# Patient Record
Sex: Female | Born: 1945 | Race: White | Hispanic: No | Smoking: Never smoker
Health system: Southern US, Community
[De-identification: ages and names within clinical notes are randomized; demographics above are authoritative.]

## PROBLEM LIST (undated history)

## (undated) DIAGNOSIS — G4730 Sleep apnea, unspecified: Secondary | ICD-10-CM

## (undated) DIAGNOSIS — K219 Gastro-esophageal reflux disease without esophagitis: Secondary | ICD-10-CM

## (undated) DIAGNOSIS — F988 Other specified behavioral and emotional disorders with onset usually occurring in childhood and adolescence: Secondary | ICD-10-CM

## (undated) DIAGNOSIS — F32A Depression, unspecified: Secondary | ICD-10-CM

## (undated) DIAGNOSIS — F329 Major depressive disorder, single episode, unspecified: Secondary | ICD-10-CM

## (undated) DIAGNOSIS — N809 Endometriosis, unspecified: Secondary | ICD-10-CM

---

## 1993-01-26 HISTORY — PX: ABDOMINAL HYSTERECTOMY: SHX81

## 1997-01-26 HISTORY — PX: BREAST CYST ASPIRATION: SHX578

## 2004-07-16 ENCOUNTER — Ambulatory Visit: Payer: Self-pay

## 2005-06-23 ENCOUNTER — Ambulatory Visit: Payer: Self-pay

## 2007-03-16 ENCOUNTER — Ambulatory Visit: Payer: Self-pay

## 2007-11-30 ENCOUNTER — Ambulatory Visit: Payer: Self-pay

## 2009-09-24 ENCOUNTER — Ambulatory Visit: Payer: Self-pay

## 2010-03-17 ENCOUNTER — Ambulatory Visit: Payer: Self-pay | Admitting: Unknown Physician Specialty

## 2010-11-18 ENCOUNTER — Ambulatory Visit: Payer: Self-pay

## 2012-03-23 ENCOUNTER — Emergency Department: Payer: Self-pay | Admitting: Unknown Physician Specialty

## 2012-03-23 LAB — BASIC METABOLIC PANEL
BUN: 12 mg/dL (ref 7–18)
Co2: 27 mmol/L (ref 21–32)
EGFR (Non-African Amer.): >60
Glucose: 89 mg/dL (ref 65–99)
Osmolality: 277 (ref 275–301)
Potassium: 4.2 mmol/L (ref 3.5–5.1)
Sodium: 139 mmol/L (ref 136–145)

## 2012-03-23 LAB — CBC
HCT: 40 % (ref 35.0–47.0)
HGB: 13.2 g/dL (ref 12.0–16.0)
MCH: 28.6 pg (ref 26.0–34.0)
MCHC: 33.1 g/dL (ref 32.0–36.0)
MCV: 86 fL (ref 80–100)
Platelet: 326 10*3/uL (ref 150–440)
RBC: 4.63 10*6/uL (ref 3.80–5.20)
RDW: 13.8 % (ref 11.5–14.5)

## 2012-03-23 LAB — CK TOTAL AND CKMB (NOT AT ARMC): CK, Total: 81 U/L (ref 21–215)

## 2012-12-26 ENCOUNTER — Emergency Department: Payer: Self-pay | Admitting: Emergency Medicine

## 2012-12-26 LAB — COMPREHENSIVE METABOLIC PANEL
Albumin: 4 g/dL (ref 3.4–5.0)
Alkaline Phosphatase: 91 U/L
Anion Gap: 3 — ABNORMAL LOW (ref 7–16)
BUN: 15 mg/dL (ref 7–18)
Calcium, Total: 9.1 mg/dL (ref 8.5–10.1)
Chloride: 107 mmol/L (ref 98–107)
Co2: 28 mmol/L (ref 21–32)
EGFR (Non-African Amer.): >60
Osmolality: 276 (ref 275–301)
Potassium: 3.8 mmol/L (ref 3.5–5.1)
SGOT(AST): 41 U/L — ABNORMAL HIGH (ref 15–37)
Sodium: 138 mmol/L (ref 136–145)

## 2012-12-26 LAB — CBC WITH DIFFERENTIAL/PLATELET
Basophil #: 0 10*3/uL (ref 0.0–0.1)
Basophil %: 0.8 %
Eosinophil %: 1.5 %
HCT: 40.5 % (ref 35.0–47.0)
HGB: 13.5 g/dL (ref 12.0–16.0)
Lymphocyte %: 37.2 %
MCH: 28.9 pg (ref 26.0–34.0)
MCV: 87 fL (ref 80–100)
Monocyte %: 6.3 %
RBC: 4.68 10*6/uL (ref 3.80–5.20)
RDW: 14.2 % (ref 11.5–14.5)
WBC: 6.5 10*3/uL (ref 3.6–11.0)

## 2012-12-26 LAB — CK TOTAL AND CKMB (NOT AT ARMC): CK, Total: 102 U/L (ref 21–215)

## 2013-11-09 ENCOUNTER — Ambulatory Visit: Payer: Self-pay | Admitting: Family Medicine

## 2014-03-05 ENCOUNTER — Ambulatory Visit: Payer: Self-pay | Admitting: Unknown Physician Specialty

## 2014-05-21 LAB — SURGICAL PATHOLOGY

## 2015-04-10 DIAGNOSIS — M5431 Sciatica, right side: Secondary | ICD-10-CM | POA: Diagnosis not present

## 2015-04-10 DIAGNOSIS — F331 Major depressive disorder, recurrent, moderate: Secondary | ICD-10-CM | POA: Diagnosis not present

## 2015-05-17 DIAGNOSIS — N390 Urinary tract infection, site not specified: Secondary | ICD-10-CM | POA: Diagnosis not present

## 2015-05-17 DIAGNOSIS — F909 Attention-deficit hyperactivity disorder, unspecified type: Secondary | ICD-10-CM | POA: Diagnosis not present

## 2015-05-17 DIAGNOSIS — M545 Low back pain: Secondary | ICD-10-CM | POA: Diagnosis not present

## 2015-05-17 DIAGNOSIS — Z1389 Encounter for screening for other disorder: Secondary | ICD-10-CM | POA: Diagnosis not present

## 2015-05-17 DIAGNOSIS — F331 Major depressive disorder, recurrent, moderate: Secondary | ICD-10-CM | POA: Diagnosis not present

## 2015-06-07 DIAGNOSIS — M545 Low back pain: Secondary | ICD-10-CM | POA: Diagnosis not present

## 2015-07-18 DIAGNOSIS — G4730 Sleep apnea, unspecified: Secondary | ICD-10-CM | POA: Diagnosis not present

## 2015-07-18 DIAGNOSIS — F909 Attention-deficit hyperactivity disorder, unspecified type: Secondary | ICD-10-CM | POA: Diagnosis not present

## 2015-07-18 DIAGNOSIS — M5431 Sciatica, right side: Secondary | ICD-10-CM | POA: Diagnosis not present

## 2015-09-03 ENCOUNTER — Ambulatory Visit: Payer: PPO | Attending: Neurology

## 2015-09-03 DIAGNOSIS — R0683 Snoring: Secondary | ICD-10-CM | POA: Diagnosis not present

## 2015-09-03 DIAGNOSIS — G4730 Sleep apnea, unspecified: Secondary | ICD-10-CM | POA: Diagnosis not present

## 2015-09-03 DIAGNOSIS — G4733 Obstructive sleep apnea (adult) (pediatric): Secondary | ICD-10-CM | POA: Diagnosis not present

## 2015-09-12 DIAGNOSIS — G4733 Obstructive sleep apnea (adult) (pediatric): Secondary | ICD-10-CM | POA: Diagnosis not present

## 2015-11-08 ENCOUNTER — Ambulatory Visit: Payer: PPO | Attending: Neurology

## 2015-11-08 DIAGNOSIS — Z7982 Long term (current) use of aspirin: Secondary | ICD-10-CM | POA: Insufficient documentation

## 2015-11-08 DIAGNOSIS — G4733 Obstructive sleep apnea (adult) (pediatric): Secondary | ICD-10-CM | POA: Insufficient documentation

## 2015-11-08 DIAGNOSIS — Z79899 Other long term (current) drug therapy: Secondary | ICD-10-CM | POA: Diagnosis not present

## 2015-11-13 DIAGNOSIS — G4733 Obstructive sleep apnea (adult) (pediatric): Secondary | ICD-10-CM | POA: Diagnosis not present

## 2015-12-11 DIAGNOSIS — G4733 Obstructive sleep apnea (adult) (pediatric): Secondary | ICD-10-CM | POA: Diagnosis not present

## 2016-01-10 DIAGNOSIS — G4733 Obstructive sleep apnea (adult) (pediatric): Secondary | ICD-10-CM | POA: Diagnosis not present

## 2016-02-10 DIAGNOSIS — G4733 Obstructive sleep apnea (adult) (pediatric): Secondary | ICD-10-CM | POA: Diagnosis not present

## 2016-02-20 DIAGNOSIS — E039 Hypothyroidism, unspecified: Secondary | ICD-10-CM | POA: Diagnosis not present

## 2016-02-20 DIAGNOSIS — Z23 Encounter for immunization: Secondary | ICD-10-CM | POA: Diagnosis not present

## 2016-02-20 DIAGNOSIS — Z0000 Encounter for general adult medical examination without abnormal findings: Secondary | ICD-10-CM | POA: Diagnosis not present

## 2016-02-20 DIAGNOSIS — M545 Low back pain: Secondary | ICD-10-CM | POA: Diagnosis not present

## 2016-02-20 DIAGNOSIS — F909 Attention-deficit hyperactivity disorder, unspecified type: Secondary | ICD-10-CM | POA: Diagnosis not present

## 2016-02-20 DIAGNOSIS — Z1389 Encounter for screening for other disorder: Secondary | ICD-10-CM | POA: Diagnosis not present

## 2016-02-20 DIAGNOSIS — Z1159 Encounter for screening for other viral diseases: Secondary | ICD-10-CM | POA: Diagnosis not present

## 2016-02-20 DIAGNOSIS — L989 Disorder of the skin and subcutaneous tissue, unspecified: Secondary | ICD-10-CM | POA: Diagnosis not present

## 2016-02-24 ENCOUNTER — Other Ambulatory Visit: Payer: Self-pay | Admitting: Family Medicine

## 2016-02-24 DIAGNOSIS — Z0000 Encounter for general adult medical examination without abnormal findings: Secondary | ICD-10-CM

## 2016-02-24 DIAGNOSIS — M8580 Other specified disorders of bone density and structure, unspecified site: Secondary | ICD-10-CM

## 2016-02-24 DIAGNOSIS — Z1231 Encounter for screening mammogram for malignant neoplasm of breast: Secondary | ICD-10-CM

## 2016-04-09 ENCOUNTER — Ambulatory Visit: Payer: PPO | Attending: Family Medicine

## 2016-04-22 DIAGNOSIS — H1713 Central corneal opacity, bilateral: Secondary | ICD-10-CM | POA: Diagnosis not present

## 2016-05-21 ENCOUNTER — Ambulatory Visit
Admission: RE | Admit: 2016-05-21 | Discharge: 2016-05-21 | Disposition: A | Discharge status: Home / Self Care | Payer: PPO | Source: Ambulatory Visit | Attending: Family Medicine | Admitting: Family Medicine

## 2016-05-21 DIAGNOSIS — M85852 Other specified disorders of bone density and structure, left thigh: Secondary | ICD-10-CM | POA: Insufficient documentation

## 2016-05-21 DIAGNOSIS — M8580 Other specified disorders of bone density and structure, unspecified site: Secondary | ICD-10-CM

## 2016-05-21 DIAGNOSIS — Z1231 Encounter for screening mammogram for malignant neoplasm of breast: Secondary | ICD-10-CM | POA: Diagnosis not present

## 2016-05-21 DIAGNOSIS — Z0000 Encounter for general adult medical examination without abnormal findings: Secondary | ICD-10-CM

## 2016-05-28 ENCOUNTER — Other Ambulatory Visit: Payer: Self-pay | Admitting: Family Medicine

## 2016-05-28 DIAGNOSIS — N6489 Other specified disorders of breast: Secondary | ICD-10-CM

## 2016-05-28 DIAGNOSIS — R928 Other abnormal and inconclusive findings on diagnostic imaging of breast: Secondary | ICD-10-CM

## 2016-06-01 DIAGNOSIS — R21 Rash and other nonspecific skin eruption: Secondary | ICD-10-CM | POA: Diagnosis not present

## 2016-06-01 DIAGNOSIS — Z1283 Encounter for screening for malignant neoplasm of skin: Secondary | ICD-10-CM | POA: Diagnosis not present

## 2016-06-01 DIAGNOSIS — S70362A Insect bite (nonvenomous), left thigh, initial encounter: Secondary | ICD-10-CM | POA: Diagnosis not present

## 2016-06-01 DIAGNOSIS — L578 Other skin changes due to chronic exposure to nonionizing radiation: Secondary | ICD-10-CM | POA: Diagnosis not present

## 2016-06-01 DIAGNOSIS — L508 Other urticaria: Secondary | ICD-10-CM | POA: Diagnosis not present

## 2016-06-01 DIAGNOSIS — L820 Inflamed seborrheic keratosis: Secondary | ICD-10-CM | POA: Diagnosis not present

## 2016-06-01 DIAGNOSIS — L812 Freckles: Secondary | ICD-10-CM | POA: Diagnosis not present

## 2016-06-01 DIAGNOSIS — L719 Rosacea, unspecified: Secondary | ICD-10-CM | POA: Diagnosis not present

## 2016-06-01 DIAGNOSIS — D1800 Hemangioma unspecified site: Secondary | ICD-10-CM | POA: Diagnosis not present

## 2016-06-03 DIAGNOSIS — H1713 Central corneal opacity, bilateral: Secondary | ICD-10-CM | POA: Diagnosis not present

## 2016-06-05 ENCOUNTER — Ambulatory Visit
Admission: RE | Admit: 2016-06-05 | Discharge: 2016-06-05 | Disposition: A | Discharge status: Home / Self Care | Payer: PPO | Source: Ambulatory Visit | Attending: Family Medicine | Admitting: Family Medicine

## 2016-06-05 ENCOUNTER — Encounter: Payer: Self-pay | Admitting: Radiology

## 2016-06-05 DIAGNOSIS — N6489 Other specified disorders of breast: Secondary | ICD-10-CM

## 2016-06-05 DIAGNOSIS — N6002 Solitary cyst of left breast: Secondary | ICD-10-CM | POA: Diagnosis not present

## 2016-06-05 DIAGNOSIS — R928 Other abnormal and inconclusive findings on diagnostic imaging of breast: Secondary | ICD-10-CM | POA: Diagnosis not present

## 2016-06-08 DIAGNOSIS — H1713 Central corneal opacity, bilateral: Secondary | ICD-10-CM | POA: Diagnosis not present

## 2016-09-02 DIAGNOSIS — L820 Inflamed seborrheic keratosis: Secondary | ICD-10-CM | POA: Diagnosis not present

## 2016-09-02 DIAGNOSIS — L578 Other skin changes due to chronic exposure to nonionizing radiation: Secondary | ICD-10-CM | POA: Diagnosis not present

## 2016-09-02 DIAGNOSIS — L821 Other seborrheic keratosis: Secondary | ICD-10-CM | POA: Diagnosis not present

## 2016-09-02 DIAGNOSIS — L719 Rosacea, unspecified: Secondary | ICD-10-CM | POA: Diagnosis not present

## 2016-09-02 DIAGNOSIS — D229 Melanocytic nevi, unspecified: Secondary | ICD-10-CM | POA: Diagnosis not present

## 2016-09-02 DIAGNOSIS — L812 Freckles: Secondary | ICD-10-CM | POA: Diagnosis not present

## 2016-09-02 DIAGNOSIS — S00461A Insect bite (nonvenomous) of right ear, initial encounter: Secondary | ICD-10-CM | POA: Diagnosis not present

## 2017-01-11 DIAGNOSIS — E039 Hypothyroidism, unspecified: Secondary | ICD-10-CM | POA: Diagnosis not present

## 2017-01-11 DIAGNOSIS — E785 Hyperlipidemia, unspecified: Secondary | ICD-10-CM | POA: Diagnosis not present

## 2017-06-02 ENCOUNTER — Other Ambulatory Visit: Payer: Self-pay | Admitting: Family Medicine

## 2017-06-02 DIAGNOSIS — Z1231 Encounter for screening mammogram for malignant neoplasm of breast: Secondary | ICD-10-CM

## 2017-06-10 DIAGNOSIS — K219 Gastro-esophageal reflux disease without esophagitis: Secondary | ICD-10-CM | POA: Diagnosis not present

## 2017-06-10 DIAGNOSIS — Z86010 Personal history of colonic polyps: Secondary | ICD-10-CM | POA: Diagnosis not present

## 2017-06-10 DIAGNOSIS — R1310 Dysphagia, unspecified: Secondary | ICD-10-CM | POA: Diagnosis not present

## 2017-06-18 ENCOUNTER — Ambulatory Visit
Admission: RE | Admit: 2017-06-18 | Discharge: 2017-06-18 | Disposition: A | Discharge status: Home / Self Care | Payer: PPO | Source: Ambulatory Visit | Attending: Family Medicine | Admitting: Family Medicine

## 2017-06-18 DIAGNOSIS — Z1231 Encounter for screening mammogram for malignant neoplasm of breast: Secondary | ICD-10-CM | POA: Diagnosis not present

## 2017-06-25 ENCOUNTER — Other Ambulatory Visit: Payer: Self-pay | Admitting: Family Medicine

## 2017-06-25 DIAGNOSIS — N6489 Other specified disorders of breast: Secondary | ICD-10-CM

## 2017-06-25 DIAGNOSIS — R928 Other abnormal and inconclusive findings on diagnostic imaging of breast: Secondary | ICD-10-CM

## 2017-06-30 ENCOUNTER — Ambulatory Visit
Admission: RE | Admit: 2017-06-30 | Discharge: 2017-06-30 | Disposition: A | Discharge status: Home / Self Care | Payer: PPO | Source: Ambulatory Visit | Attending: Family Medicine | Admitting: Family Medicine

## 2017-06-30 DIAGNOSIS — R928 Other abnormal and inconclusive findings on diagnostic imaging of breast: Secondary | ICD-10-CM

## 2017-06-30 DIAGNOSIS — N6489 Other specified disorders of breast: Secondary | ICD-10-CM | POA: Insufficient documentation

## 2017-06-30 DIAGNOSIS — R922 Inconclusive mammogram: Secondary | ICD-10-CM | POA: Diagnosis not present

## 2017-07-06 DIAGNOSIS — Z86010 Personal history of colonic polyps: Secondary | ICD-10-CM | POA: Diagnosis not present

## 2017-07-22 DIAGNOSIS — H43399 Other vitreous opacities, unspecified eye: Secondary | ICD-10-CM | POA: Diagnosis not present

## 2017-08-18 DIAGNOSIS — M25562 Pain in left knee: Secondary | ICD-10-CM | POA: Diagnosis not present

## 2017-08-20 ENCOUNTER — Encounter: Payer: Self-pay | Admitting: *Deleted

## 2017-08-23 ENCOUNTER — Encounter: Payer: Self-pay | Admitting: Anesthesiology

## 2017-08-23 ENCOUNTER — Ambulatory Visit: Payer: PPO | Admitting: Anesthesiology

## 2017-08-23 ENCOUNTER — Ambulatory Visit
Admission: RE | Admit: 2017-08-23 | Discharge: 2017-08-23 | Disposition: A | Discharge status: Home / Self Care | Payer: PPO | Source: Ambulatory Visit | Attending: Unknown Physician Specialty | Admitting: Unknown Physician Specialty

## 2017-08-23 ENCOUNTER — Other Ambulatory Visit: Payer: Self-pay

## 2017-08-23 ENCOUNTER — Encounter
Admission: RE | Disposition: A | Discharge status: Home / Self Care | Payer: Self-pay | Source: Ambulatory Visit | Attending: Unknown Physician Specialty

## 2017-08-23 DIAGNOSIS — F329 Major depressive disorder, single episode, unspecified: Secondary | ICD-10-CM | POA: Diagnosis not present

## 2017-08-23 DIAGNOSIS — Z79899 Other long term (current) drug therapy: Secondary | ICD-10-CM | POA: Insufficient documentation

## 2017-08-23 DIAGNOSIS — G4730 Sleep apnea, unspecified: Secondary | ICD-10-CM | POA: Insufficient documentation

## 2017-08-23 DIAGNOSIS — Z86010 Personal history of colonic polyps: Secondary | ICD-10-CM | POA: Diagnosis not present

## 2017-08-23 DIAGNOSIS — Q439 Congenital malformation of intestine, unspecified: Secondary | ICD-10-CM | POA: Insufficient documentation

## 2017-08-23 DIAGNOSIS — K640 First degree hemorrhoids: Secondary | ICD-10-CM | POA: Diagnosis not present

## 2017-08-23 DIAGNOSIS — K219 Gastro-esophageal reflux disease without esophagitis: Secondary | ICD-10-CM | POA: Diagnosis not present

## 2017-08-23 DIAGNOSIS — K6389 Other specified diseases of intestine: Secondary | ICD-10-CM | POA: Diagnosis not present

## 2017-08-23 DIAGNOSIS — Z09 Encounter for follow-up examination after completed treatment for conditions other than malignant neoplasm: Secondary | ICD-10-CM | POA: Insufficient documentation

## 2017-08-23 DIAGNOSIS — K222 Esophageal obstruction: Secondary | ICD-10-CM | POA: Diagnosis not present

## 2017-08-23 DIAGNOSIS — K635 Polyp of colon: Secondary | ICD-10-CM | POA: Diagnosis not present

## 2017-08-23 DIAGNOSIS — D125 Benign neoplasm of sigmoid colon: Secondary | ICD-10-CM | POA: Diagnosis not present

## 2017-08-23 DIAGNOSIS — F988 Other specified behavioral and emotional disorders with onset usually occurring in childhood and adolescence: Secondary | ICD-10-CM | POA: Insufficient documentation

## 2017-08-23 DIAGNOSIS — R1310 Dysphagia, unspecified: Secondary | ICD-10-CM | POA: Diagnosis not present

## 2017-08-23 HISTORY — DX: Endometriosis, unspecified: N80.9

## 2017-08-23 HISTORY — DX: Depression, unspecified: F32.A

## 2017-08-23 HISTORY — DX: Other specified behavioral and emotional disorders with onset usually occurring in childhood and adolescence: F98.8

## 2017-08-23 HISTORY — DX: Major depressive disorder, single episode, unspecified: F32.9

## 2017-08-23 HISTORY — PX: COLONOSCOPY WITH PROPOFOL: SHX5780

## 2017-08-23 HISTORY — DX: Gastro-esophageal reflux disease without esophagitis: K21.9

## 2017-08-23 HISTORY — DX: Sleep apnea, unspecified: G47.30

## 2017-08-23 HISTORY — PX: ESOPHAGOGASTRODUODENOSCOPY (EGD) WITH PROPOFOL: SHX5813

## 2017-08-23 SURGERY — COLONOSCOPY WITH PROPOFOL
Anesthesia: General

## 2017-08-23 MED ORDER — LIDOCAINE HCL (PF) 2 % IJ SOLN
INTRAMUSCULAR | Status: DC | PRN
  Administered 2017-08-23: 80 mg

## 2017-08-23 MED ORDER — MIDAZOLAM HCL 5 MG/5ML IJ SOLN
INTRAMUSCULAR | Status: DC | PRN
  Administered 2017-08-23: 2 mg via INTRAVENOUS

## 2017-08-23 MED ORDER — SODIUM CHLORIDE 0.9 % IV SOLN
INTRAVENOUS | Status: DC
  Administered 2017-08-23: 14:00:00 via INTRAVENOUS

## 2017-08-23 MED ORDER — LIDOCAINE HCL (PF) 2 % IJ SOLN
INTRAMUSCULAR | Status: AC
  Filled 2017-08-23: qty 10

## 2017-08-23 MED ORDER — PROPOFOL 500 MG/50ML IV EMUL
INTRAVENOUS | Status: AC
  Filled 2017-08-23: qty 50

## 2017-08-23 MED ORDER — FENTANYL CITRATE (PF) 100 MCG/2ML IJ SOLN
INTRAMUSCULAR | Status: DC | PRN
  Administered 2017-08-23: 25 ug via INTRAVENOUS
  Administered 2017-08-23: 50 ug via INTRAVENOUS
  Administered 2017-08-23: 25 ug via INTRAVENOUS

## 2017-08-23 MED ORDER — GLYCOPYRROLATE 0.2 MG/ML IJ SOLN
INTRAMUSCULAR | Status: AC
  Filled 2017-08-23: qty 1

## 2017-08-23 MED ORDER — EPHEDRINE SULFATE 50 MG/ML IJ SOLN
INTRAMUSCULAR | Status: DC | PRN
  Administered 2017-08-23: 10 mg via INTRAVENOUS
  Administered 2017-08-23: 5 mg via INTRAVENOUS
  Administered 2017-08-23: 10 mg via INTRAVENOUS

## 2017-08-23 MED ORDER — PROPOFOL 10 MG/ML IV BOLUS
INTRAVENOUS | Status: DC | PRN
  Administered 2017-08-23: 30 mg via INTRAVENOUS
  Administered 2017-08-23 (×3): 20 mg via INTRAVENOUS

## 2017-08-23 MED ORDER — PROPOFOL 500 MG/50ML IV EMUL
INTRAVENOUS | Status: DC | PRN
  Administered 2017-08-23: 50 ug/kg/min via INTRAVENOUS

## 2017-08-23 MED ORDER — GLYCOPYRROLATE 0.2 MG/ML IJ SOLN
INTRAMUSCULAR | Status: DC | PRN
  Administered 2017-08-23: 0.2 mg via INTRAVENOUS

## 2017-08-23 MED ORDER — MIDAZOLAM HCL 2 MG/2ML IJ SOLN
INTRAMUSCULAR | Status: AC
  Filled 2017-08-23: qty 2

## 2017-08-23 MED ORDER — FENTANYL CITRATE (PF) 100 MCG/2ML IJ SOLN
INTRAMUSCULAR | Status: AC
  Filled 2017-08-23: qty 2

## 2017-08-23 NOTE — Anesthesia Postprocedure Evaluation (Signed)
Anesthesia Post Note  Patient: Sharon Palmer  Procedure(s) Performed: COLONOSCOPY WITH PROPOFOL (N/A ) ESOPHAGOGASTRODUODENOSCOPY (EGD) WITH PROPOFOL (N/A )  Patient location during evaluation: Endoscopy Anesthesia Type: General Level of consciousness: awake and alert Pain management: pain level controlled Vital Signs Assessment: post-procedure vital signs reviewed and stable Respiratory status: spontaneous breathing, nonlabored ventilation, respiratory function stable and patient connected to nasal cannula oxygen Cardiovascular status: blood pressure returned to baseline and stable Postop Assessment: no apparent nausea or vomiting Anesthetic complications: no     Last Vitals:  Vitals:   08/23/17 1454 08/23/17 1501  BP: 115/62 121/66  Pulse: 84   Resp: (!) 23   Temp:    SpO2: 99%     Last Pain:  Vitals:   08/23/17 1454  TempSrc:   PainSc: 0-No pain                 [name] S

## 2017-08-23 NOTE — H&P (Signed)
Primary Care Physician:  Denton Lank, MD Primary Gastroenterologist:  Dr. Vira Agar  Pre-Procedure History & Physical: HPI:  Sharon Palmer is a 72 y.o. female is here for an endoscopy and colonoscopy.  Done for [facility] colon polyps and dysphagia.   Past Medical History:  Diagnosis Date  . Attention deficit disorder   . Depression   . Endometriosis   . GERD (gastroesophageal reflux disease)   . Sleep apnea     Past Surgical History:  Procedure Laterality Date  . ABDOMINAL HYSTERECTOMY  1995  . BREAST CYST ASPIRATION Left 1999    Prior to Admission medications   Medication Sig Start Date End Date Taking? Authorizing Provider  Amphetamine-Dextroamphetamine (ADDERALL PO) Take by mouth 2 (two) times daily.   Yes [provider]  FLUOXETINE HCL PO Take by mouth daily.   Yes [provider]  pantoprazole (PROTONIX) 40 MG tablet Take 40 mg by mouth daily. Take 15-20 mins before meal   Yes [provider]  ROSUVASTATIN CALCIUM PO Take by mouth daily.   Yes [provider]    Allergies as of 06/23/2017  . (Not on File)    Family History  Problem Relation Age of Onset  . Breast cancer Neg Hx     Social History   Socioeconomic History  . Marital status: Widowed    Spouse name: Not on file  . Number of children: Not on file  . Years of education: Not on file  . Highest education level: Not on file  Occupational History  . Not on file  Social Needs  . Financial resource strain: Not on file  . Food insecurity:    Worry: Not on file    Inability: Not on file  . Transportation needs:    Medical: Not on file    Non-medical: Not on file  Tobacco Use  . Smoking status: Never Smoker  . Smokeless tobacco: Never Used  Substance and Sexual Activity  . Alcohol use: Yes    Alcohol/week: 1.8 oz    Types: 3 Shots of liquor per week    Comment: 3 drinks 3x a week  . Drug use: Not Currently  . Sexual activity: Not on file  Lifestyle  . Physical  activity:    Days per week: Not on file    Minutes per session: Not on file  . Stress: Not on file  Relationships  . Social connections:    Talks on phone: Not on file    Gets together: Not on file    Attends religious service: Not on file    Active member of club or organization: Not on file    Attends meetings of clubs or organizations: Not on file    Relationship status: Not on file  . Intimate partner violence:    Fear of current or ex partner: Not on file    Emotionally abused: Not on file    Physically abused: Not on file    Forced sexual activity: Not on file  Other Topics Concern  . Not on file  Social History Narrative  . Not on file    Review of Systems: See HPI, otherwise negative ROS  Physical Exam: BP (!) 149/96   Pulse 74   Temp (!) 97.4 F (36.3 C) (Tympanic)   Resp 18   Ht 5\' 5"  (1.651 m)   Wt 86.2 kg (190 lb)   SpO2 95%   BMI 31.62 kg/m  General:   Alert,  pleasant and  cooperative in NAD Head:  Normocephalic and atraumatic. Neck:  Supple; no masses or thyromegaly. Lungs:  Clear throughout to auscultation.    Heart:  Regular rate and rhythm. Abdomen:  Soft, nontender and nondistended. Normal bowel sounds, without guarding, and without rebound.   Neurologic:  Alert and  oriented x4;  grossly normal neurologically.  Impression/Plan: [name] is here for an endoscopy and colonoscopy to be performed for [facility] colon polyps and dysphagia.  Risks, benefits, limitations, and alternatives regarding  endoscopy and colonoscopy have been reviewed with the patient.  Questions have been answered.  All parties agreeable.   Gaylyn Cheers, MD  08/23/2017, 1:39 PM

## 2017-08-23 NOTE — Op Note (Signed)
[facility]astroenterology Patient Name: Sharon Palmer Procedure Date: 08/23/2017 1:39 PM MRN: [id number] Account #: [id number] Date of Birth: 05/16/1945 Admit Type: Outpatient Age: 72 Room: [facility] ENDO ROOM 1 Gender: Female Note Status: Finalized Procedure:            Colonoscopy Indications:          High risk colon cancer surveillance: Personal history                        of colonic polyps Providers:            Manya Silvas, MD Referring MD:         Denton Lank MD, MD (Referring MD) Medicines:            Propofol per Anesthesia Complications:        No immediate complications. Procedure:            Pre-Anesthesia Assessment:                       - After reviewing the risks and benefits, the patient                        was deemed in satisfactory condition to undergo the                        procedure.                       After obtaining informed consent, the colonoscope was                        passed under direct vision. Throughout the procedure,                        the patient's blood pressure, pulse, and oxygen                        saturations were monitored continuously. The                        Colonoscope was introduced through the anus and                        advanced to the the cecum, identified by appendiceal                        orifice and ileocecal valve. The colonoscopy was                        somewhat difficult due to a tortuous colon. Successful                        completion of the procedure was aided by applying                        abdominal pressure. The patient tolerated the procedure                        well. The quality of the bowel preparation was  excellent. Findings:      A diminutive polyp was found in the sigmoid colon. The polyp was       sessile. The polyp was removed with a jumbo cold forceps. Resection and       retrieval were complete.      Internal hemorrhoids were  found during endoscopy. The hemorrhoids were       small and Grade I (internal hemorrhoids that do not prolapse).      The exam was otherwise without abnormality. Impression:           - One diminutive polyp in the sigmoid colon, removed                        with a jumbo cold forceps. Resected and retrieved.                       - Internal hemorrhoids.                       - The examination was otherwise normal. Recommendation:       - Await pathology results. Manya Silvas, MD 08/23/2017 2:29:42 PM This report has been signed electronically. Number of Addenda: 0 Note Initiated On: 08/23/2017 1:39 PM Scope Withdrawal Time: 0 hours 6 minutes 55 seconds  Total Procedure Duration: 0 hours 21 minutes 8 seconds       [facility]

## 2017-08-23 NOTE — Anesthesia Preprocedure Evaluation (Signed)
Anesthesia Evaluation  Patient identified by MRN, date of birth, ID band Patient awake    Reviewed: Allergy & Precautions, NPO status , Patient's Chart, lab work & pertinent test results, reviewed documented beta blocker date and time   Airway Mallampati: III  TM Distance: >3 FB     Dental  (+) Chipped   Pulmonary           Cardiovascular      Neuro/Psych    GI/Hepatic   Endo/Other    Renal/GU      Musculoskeletal   Abdominal   Peds  Hematology   Anesthesia Other Findings   Reproductive/Obstetrics                             Anesthesia Physical Anesthesia Plan  ASA: II  Anesthesia Plan: General   Post-op Pain Management:    Induction: Intravenous  PONV Risk Score and Plan:   Airway Management Planned: Oral ETT  Additional Equipment:   Intra-op Plan:   Post-operative Plan:   Informed Consent: I have reviewed the patients History and Physical, chart, labs and discussed the procedure including the risks, benefits and alternatives for the proposed anesthesia with the patient or authorized representative who has indicated his/her understanding and acceptance.       Plan Discussed with: CRNA  Anesthesia Plan Comments:         Anesthesia Quick Evaluation  

## 2017-08-23 NOTE — Op Note (Signed)
[facility] Gastroenterology Patient Name: Sharon Palmer Procedure Date: 08/23/2017 1:43 PM MRN: [id number] Account #: [id number] Date of Birth: 09/30/1945 Admit Type: Outpatient Age: 72 Room: [facility] ENDO ROOM 1 Gender: Female Note Status: Finalized Procedure:            Upper GI endoscopy Indications:          Dysphagia Providers:            Manya Silvas, MD Referring MD:         Denton Lank MD, MD (Referring MD) Medicines:            Propofol per Anesthesia Complications:        No immediate complications. Procedure:            Pre-Anesthesia Assessment:                       - After reviewing the risks and benefits, the patient                        was deemed in satisfactory condition to undergo the                        procedure.                       After obtaining informed consent, the endoscope was                        passed under direct vision. Throughout the procedure,                        the patient's blood pressure, pulse, and oxygen                        saturations were monitored continuously. The Endoscope                        was introduced through the mouth, and advanced to the                        second part of duodenum. The upper GI endoscopy was                        accomplished without difficulty. The patient tolerated                        the procedure well. Findings:      A mild Schatzki ring was found at the gastroesophageal junction. At the       end of the procedure A guidewire was placed and the scope was withdrawn.       Dilation was performed with a Savary dilator with moderate resistance at       17 mm.      The entire examined stomach was otherwise.normal.      The examined duodenum was normal. Impression:           - Mild Schatzki ring. Dilated.                       - Normal stomach.                       -  Normal examined duodenum.                       - No specimens collected. Recommendation:       -  soft food for 3 days, eat slowly, chew well, take                        small bites Manya Silvas, MD 08/23/2017 2:02:44 PM This report has been signed electronically. Number of Addenda: 0 Note Initiated On: 08/23/2017 1:43 PM      [facility]

## 2017-08-23 NOTE — Transfer of Care (Signed)
Immediate Anesthesia Transfer of Care Note  Patient: [name]  Procedure(s) Performed: COLONOSCOPY WITH PROPOFOL (N/A ) ESOPHAGOGASTRODUODENOSCOPY (EGD) WITH PROPOFOL (N/A )  Patient Location: PACU  Anesthesia Type:General  Level of Consciousness: sedated  Airway & Oxygen Therapy: Patient Spontanous Breathing and Patient connected to nasal cannula oxygen  Post-op Assessment: Report given to RN and Post -op Vital signs reviewed and stable  Post vital signs: Reviewed and stable  Last Vitals:  Vitals Value Taken Time  BP    Temp    Pulse 95 08/23/2017  2:30 PM  Resp 20 08/23/2017  2:30 PM  SpO2 98 % 08/23/2017  2:30 PM  Vitals shown include unvalidated device data.  Last Pain:  Vitals:   08/23/17 1317  TempSrc: Tympanic  PainSc: 0-No pain         Complications: No apparent anesthesia complications

## 2017-08-23 NOTE — Anesthesia Post-op Follow-up Note (Signed)
Anesthesia QCDR form completed.        

## 2017-08-25 LAB — SURGICAL PATHOLOGY

## 2017-09-08 DIAGNOSIS — M25562 Pain in left knee: Secondary | ICD-10-CM | POA: Diagnosis not present

## 2017-09-13 DIAGNOSIS — M25562 Pain in left knee: Secondary | ICD-10-CM | POA: Diagnosis not present

## 2017-12-27 DIAGNOSIS — Z23 Encounter for immunization: Secondary | ICD-10-CM | POA: Diagnosis not present

## 2017-12-27 DIAGNOSIS — E039 Hypothyroidism, unspecified: Secondary | ICD-10-CM | POA: Diagnosis not present

## 2017-12-27 DIAGNOSIS — E785 Hyperlipidemia, unspecified: Secondary | ICD-10-CM | POA: Diagnosis not present

## 2017-12-27 DIAGNOSIS — K219 Gastro-esophageal reflux disease without esophagitis: Secondary | ICD-10-CM | POA: Diagnosis not present

## 2017-12-27 DIAGNOSIS — Z0000 Encounter for general adult medical examination without abnormal findings: Secondary | ICD-10-CM | POA: Diagnosis not present

## 2017-12-27 DIAGNOSIS — Z1389 Encounter for screening for other disorder: Secondary | ICD-10-CM | POA: Diagnosis not present

## 2017-12-27 DIAGNOSIS — F909 Attention-deficit hyperactivity disorder, unspecified type: Secondary | ICD-10-CM | POA: Diagnosis not present

## 2017-12-27 DIAGNOSIS — F419 Anxiety disorder, unspecified: Secondary | ICD-10-CM | POA: Diagnosis not present

## 2018-02-08 DIAGNOSIS — E785 Hyperlipidemia, unspecified: Secondary | ICD-10-CM | POA: Diagnosis not present

## 2018-02-08 DIAGNOSIS — F331 Major depressive disorder, recurrent, moderate: Secondary | ICD-10-CM | POA: Diagnosis not present

## 2018-03-24 DIAGNOSIS — E669 Obesity, unspecified: Secondary | ICD-10-CM | POA: Diagnosis not present

## 2018-03-24 DIAGNOSIS — F909 Attention-deficit hyperactivity disorder, unspecified type: Secondary | ICD-10-CM | POA: Diagnosis not present

## 2018-04-19 DIAGNOSIS — E785 Hyperlipidemia, unspecified: Secondary | ICD-10-CM | POA: Diagnosis not present

## 2018-04-19 DIAGNOSIS — F331 Major depressive disorder, recurrent, moderate: Secondary | ICD-10-CM | POA: Diagnosis not present

## 2018-05-26 DIAGNOSIS — E785 Hyperlipidemia, unspecified: Secondary | ICD-10-CM | POA: Diagnosis not present

## 2018-05-26 DIAGNOSIS — K219 Gastro-esophageal reflux disease without esophagitis: Secondary | ICD-10-CM | POA: Diagnosis not present

## 2018-06-15 DIAGNOSIS — K219 Gastro-esophageal reflux disease without esophagitis: Secondary | ICD-10-CM | POA: Diagnosis not present

## 2018-06-15 DIAGNOSIS — E785 Hyperlipidemia, unspecified: Secondary | ICD-10-CM | POA: Diagnosis not present

## 2018-07-15 DIAGNOSIS — K219 Gastro-esophageal reflux disease without esophagitis: Secondary | ICD-10-CM | POA: Diagnosis not present

## 2018-07-15 DIAGNOSIS — E785 Hyperlipidemia, unspecified: Secondary | ICD-10-CM | POA: Diagnosis not present

## 2018-08-26 DIAGNOSIS — F331 Major depressive disorder, recurrent, moderate: Secondary | ICD-10-CM | POA: Diagnosis not present

## 2018-08-26 DIAGNOSIS — E785 Hyperlipidemia, unspecified: Secondary | ICD-10-CM | POA: Diagnosis not present

## 2018-09-02 DIAGNOSIS — E785 Hyperlipidemia, unspecified: Secondary | ICD-10-CM | POA: Diagnosis not present

## 2018-09-02 DIAGNOSIS — K219 Gastro-esophageal reflux disease without esophagitis: Secondary | ICD-10-CM | POA: Diagnosis not present

## 2018-11-08 DIAGNOSIS — E785 Hyperlipidemia, unspecified: Secondary | ICD-10-CM | POA: Diagnosis not present

## 2018-11-08 DIAGNOSIS — K219 Gastro-esophageal reflux disease without esophagitis: Secondary | ICD-10-CM | POA: Diagnosis not present

## 2018-11-24 DIAGNOSIS — K219 Gastro-esophageal reflux disease without esophagitis: Secondary | ICD-10-CM | POA: Diagnosis not present

## 2018-11-24 DIAGNOSIS — E785 Hyperlipidemia, unspecified: Secondary | ICD-10-CM | POA: Diagnosis not present

## 2018-11-24 DIAGNOSIS — Z23 Encounter for immunization: Secondary | ICD-10-CM | POA: Diagnosis not present

## 2018-11-24 DIAGNOSIS — F909 Attention-deficit hyperactivity disorder, unspecified type: Secondary | ICD-10-CM | POA: Diagnosis not present

## 2018-11-24 DIAGNOSIS — Z1389 Encounter for screening for other disorder: Secondary | ICD-10-CM | POA: Diagnosis not present

## 2018-11-24 DIAGNOSIS — F331 Major depressive disorder, recurrent, moderate: Secondary | ICD-10-CM | POA: Diagnosis not present

## 2018-11-24 DIAGNOSIS — Z0000 Encounter for general adult medical examination without abnormal findings: Secondary | ICD-10-CM | POA: Diagnosis not present

## 2018-11-24 DIAGNOSIS — E039 Hypothyroidism, unspecified: Secondary | ICD-10-CM | POA: Diagnosis not present

## 2018-12-05 DIAGNOSIS — F331 Major depressive disorder, recurrent, moderate: Secondary | ICD-10-CM | POA: Diagnosis not present

## 2019-01-12 DIAGNOSIS — F331 Major depressive disorder, recurrent, moderate: Secondary | ICD-10-CM | POA: Diagnosis not present

## 2019-01-12 DIAGNOSIS — F909 Attention-deficit hyperactivity disorder, unspecified type: Secondary | ICD-10-CM | POA: Diagnosis not present

## 2019-02-07 DIAGNOSIS — F909 Attention-deficit hyperactivity disorder, unspecified type: Secondary | ICD-10-CM | POA: Diagnosis not present

## 2019-02-07 DIAGNOSIS — E785 Hyperlipidemia, unspecified: Secondary | ICD-10-CM | POA: Diagnosis not present

## 2019-02-08 DIAGNOSIS — E785 Hyperlipidemia, unspecified: Secondary | ICD-10-CM | POA: Diagnosis not present

## 2019-02-08 DIAGNOSIS — K219 Gastro-esophageal reflux disease without esophagitis: Secondary | ICD-10-CM | POA: Diagnosis not present

## 2019-03-01 DIAGNOSIS — F331 Major depressive disorder, recurrent, moderate: Secondary | ICD-10-CM | POA: Diagnosis not present

## 2019-03-01 DIAGNOSIS — E669 Obesity, unspecified: Secondary | ICD-10-CM | POA: Diagnosis not present

## 2019-03-01 DIAGNOSIS — F413 Other mixed anxiety disorders: Secondary | ICD-10-CM | POA: Diagnosis not present

## 2019-03-01 DIAGNOSIS — E785 Hyperlipidemia, unspecified: Secondary | ICD-10-CM | POA: Diagnosis not present

## 2019-03-04 DIAGNOSIS — Z23 Encounter for immunization: Secondary | ICD-10-CM | POA: Diagnosis not present

## 2019-04-05 DIAGNOSIS — Z23 Encounter for immunization: Secondary | ICD-10-CM | POA: Diagnosis not present

## 2019-04-05 DIAGNOSIS — F331 Major depressive disorder, recurrent, moderate: Secondary | ICD-10-CM | POA: Diagnosis not present

## 2019-04-05 DIAGNOSIS — E785 Hyperlipidemia, unspecified: Secondary | ICD-10-CM | POA: Diagnosis not present

## 2019-05-03 DIAGNOSIS — F331 Major depressive disorder, recurrent, moderate: Secondary | ICD-10-CM | POA: Diagnosis not present

## 2019-06-05 DIAGNOSIS — E785 Hyperlipidemia, unspecified: Secondary | ICD-10-CM | POA: Diagnosis not present

## 2019-06-05 DIAGNOSIS — M8580 Other specified disorders of bone density and structure, unspecified site: Secondary | ICD-10-CM | POA: Diagnosis not present

## 2019-06-15 DIAGNOSIS — H43399 Other vitreous opacities, unspecified eye: Secondary | ICD-10-CM | POA: Diagnosis not present

## 2019-06-15 DIAGNOSIS — H1713 Central corneal opacity, bilateral: Secondary | ICD-10-CM | POA: Diagnosis not present

## 2019-07-21 DIAGNOSIS — F331 Major depressive disorder, recurrent, moderate: Secondary | ICD-10-CM | POA: Diagnosis not present

## 2019-07-21 DIAGNOSIS — M8580 Other specified disorders of bone density and structure, unspecified site: Secondary | ICD-10-CM | POA: Diagnosis not present

## 2019-08-21 DIAGNOSIS — E785 Hyperlipidemia, unspecified: Secondary | ICD-10-CM | POA: Diagnosis not present

## 2019-08-21 DIAGNOSIS — K219 Gastro-esophageal reflux disease without esophagitis: Secondary | ICD-10-CM | POA: Diagnosis not present

## 2019-09-11 DIAGNOSIS — K219 Gastro-esophageal reflux disease without esophagitis: Secondary | ICD-10-CM | POA: Diagnosis not present

## 2019-09-11 DIAGNOSIS — E039 Hypothyroidism, unspecified: Secondary | ICD-10-CM | POA: Diagnosis not present

## 2019-09-11 DIAGNOSIS — E785 Hyperlipidemia, unspecified: Secondary | ICD-10-CM | POA: Diagnosis not present

## 2019-09-11 DIAGNOSIS — Z0000 Encounter for general adult medical examination without abnormal findings: Secondary | ICD-10-CM | POA: Diagnosis not present

## 2019-09-11 DIAGNOSIS — N390 Urinary tract infection, site not specified: Secondary | ICD-10-CM | POA: Diagnosis not present

## 2019-09-11 DIAGNOSIS — Z1389 Encounter for screening for other disorder: Secondary | ICD-10-CM | POA: Diagnosis not present

## 2019-09-11 DIAGNOSIS — F909 Attention-deficit hyperactivity disorder, unspecified type: Secondary | ICD-10-CM | POA: Diagnosis not present

## 2019-09-19 DIAGNOSIS — E785 Hyperlipidemia, unspecified: Secondary | ICD-10-CM | POA: Diagnosis not present

## 2019-09-19 DIAGNOSIS — F331 Major depressive disorder, recurrent, moderate: Secondary | ICD-10-CM | POA: Diagnosis not present

## 2019-09-28 ENCOUNTER — Other Ambulatory Visit: Payer: Self-pay | Admitting: Family Medicine

## 2019-09-28 DIAGNOSIS — M8580 Other specified disorders of bone density and structure, unspecified site: Secondary | ICD-10-CM

## 2019-09-28 DIAGNOSIS — Z1231 Encounter for screening mammogram for malignant neoplasm of breast: Secondary | ICD-10-CM

## 2019-10-24 DIAGNOSIS — E785 Hyperlipidemia, unspecified: Secondary | ICD-10-CM | POA: Diagnosis not present

## 2019-10-24 DIAGNOSIS — F331 Major depressive disorder, recurrent, moderate: Secondary | ICD-10-CM | POA: Diagnosis not present

## 2019-10-30 ENCOUNTER — Other Ambulatory Visit: Payer: PPO

## 2019-11-14 DIAGNOSIS — E785 Hyperlipidemia, unspecified: Secondary | ICD-10-CM | POA: Diagnosis not present

## 2019-11-14 DIAGNOSIS — K219 Gastro-esophageal reflux disease without esophagitis: Secondary | ICD-10-CM | POA: Diagnosis not present

## 2019-11-22 ENCOUNTER — Other Ambulatory Visit: Payer: PPO

## 2020-01-11 ENCOUNTER — Ambulatory Visit
Admission: RE | Admit: 2020-01-11 | Discharge: 2020-01-11 | Disposition: A | Discharge status: Home / Self Care | Payer: PPO | Source: Ambulatory Visit | Attending: Family Medicine | Admitting: Family Medicine

## 2020-01-11 ENCOUNTER — Other Ambulatory Visit: Payer: Self-pay

## 2020-01-11 DIAGNOSIS — Z1231 Encounter for screening mammogram for malignant neoplasm of breast: Secondary | ICD-10-CM | POA: Insufficient documentation

## 2020-01-11 DIAGNOSIS — M8580 Other specified disorders of bone density and structure, unspecified site: Secondary | ICD-10-CM

## 2020-01-11 DIAGNOSIS — M85851 Other specified disorders of bone density and structure, right thigh: Secondary | ICD-10-CM | POA: Diagnosis not present

## 2020-01-11 DIAGNOSIS — Z13820 Encounter for screening for osteoporosis: Secondary | ICD-10-CM | POA: Diagnosis not present

## 2020-01-11 DIAGNOSIS — Z780 Asymptomatic menopausal state: Secondary | ICD-10-CM | POA: Insufficient documentation

## 2020-01-11 DIAGNOSIS — R296 Repeated falls: Secondary | ICD-10-CM | POA: Diagnosis not present

## 2020-01-11 DIAGNOSIS — R29890 Loss of height: Secondary | ICD-10-CM | POA: Diagnosis not present

## 2020-01-25 DIAGNOSIS — E785 Hyperlipidemia, unspecified: Secondary | ICD-10-CM | POA: Diagnosis not present

## 2020-01-25 DIAGNOSIS — F331 Major depressive disorder, recurrent, moderate: Secondary | ICD-10-CM | POA: Diagnosis not present

## 2020-02-21 DIAGNOSIS — E785 Hyperlipidemia, unspecified: Secondary | ICD-10-CM | POA: Diagnosis not present

## 2020-02-21 DIAGNOSIS — K219 Gastro-esophageal reflux disease without esophagitis: Secondary | ICD-10-CM | POA: Diagnosis not present

## 2020-03-07 DIAGNOSIS — F331 Major depressive disorder, recurrent, moderate: Secondary | ICD-10-CM | POA: Diagnosis not present

## 2020-04-09 DIAGNOSIS — K219 Gastro-esophageal reflux disease without esophagitis: Secondary | ICD-10-CM | POA: Diagnosis not present

## 2020-04-09 DIAGNOSIS — E785 Hyperlipidemia, unspecified: Secondary | ICD-10-CM | POA: Diagnosis not present

## 2020-04-09 DIAGNOSIS — F909 Attention-deficit hyperactivity disorder, unspecified type: Secondary | ICD-10-CM | POA: Diagnosis not present

## 2020-06-05 DIAGNOSIS — F331 Major depressive disorder, recurrent, moderate: Secondary | ICD-10-CM | POA: Diagnosis not present

## 2020-06-05 DIAGNOSIS — E785 Hyperlipidemia, unspecified: Secondary | ICD-10-CM | POA: Diagnosis not present

## 2020-07-09 DIAGNOSIS — K219 Gastro-esophageal reflux disease without esophagitis: Secondary | ICD-10-CM | POA: Diagnosis not present

## 2020-08-05 DIAGNOSIS — F909 Attention-deficit hyperactivity disorder, unspecified type: Secondary | ICD-10-CM | POA: Diagnosis not present

## 2020-08-05 DIAGNOSIS — Z0000 Encounter for general adult medical examination without abnormal findings: Secondary | ICD-10-CM | POA: Diagnosis not present

## 2020-08-05 DIAGNOSIS — Z5181 Encounter for therapeutic drug level monitoring: Secondary | ICD-10-CM | POA: Diagnosis not present

## 2020-08-05 DIAGNOSIS — Z1389 Encounter for screening for other disorder: Secondary | ICD-10-CM | POA: Diagnosis not present

## 2020-08-05 DIAGNOSIS — Z712 Person consulting for explanation of examination or test findings: Secondary | ICD-10-CM | POA: Diagnosis not present

## 2020-10-24 DIAGNOSIS — L989 Disorder of the skin and subcutaneous tissue, unspecified: Secondary | ICD-10-CM | POA: Diagnosis not present

## 2020-10-24 DIAGNOSIS — Z5181 Encounter for therapeutic drug level monitoring: Secondary | ICD-10-CM | POA: Diagnosis not present

## 2020-10-24 DIAGNOSIS — Z23 Encounter for immunization: Secondary | ICD-10-CM | POA: Diagnosis not present

## 2020-10-24 DIAGNOSIS — E039 Hypothyroidism, unspecified: Secondary | ICD-10-CM | POA: Diagnosis not present

## 2020-10-24 DIAGNOSIS — Z0000 Encounter for general adult medical examination without abnormal findings: Secondary | ICD-10-CM | POA: Diagnosis not present

## 2020-10-24 DIAGNOSIS — Z1389 Encounter for screening for other disorder: Secondary | ICD-10-CM | POA: Diagnosis not present

## 2020-10-24 DIAGNOSIS — R7309 Other abnormal glucose: Secondary | ICD-10-CM | POA: Diagnosis not present

## 2020-10-24 DIAGNOSIS — E785 Hyperlipidemia, unspecified: Secondary | ICD-10-CM | POA: Diagnosis not present

## 2020-10-29 ENCOUNTER — Other Ambulatory Visit: Payer: Self-pay | Admitting: Family Medicine

## 2020-10-29 DIAGNOSIS — Z1231 Encounter for screening mammogram for malignant neoplasm of breast: Secondary | ICD-10-CM

## 2021-01-13 ENCOUNTER — Other Ambulatory Visit: Payer: Self-pay

## 2021-01-13 ENCOUNTER — Ambulatory Visit
Admission: RE | Admit: 2021-01-13 | Discharge: 2021-01-13 | Disposition: A | Discharge status: Home / Self Care | Payer: PPO | Source: Ambulatory Visit | Attending: Family Medicine | Admitting: Family Medicine

## 2021-01-13 DIAGNOSIS — Z1231 Encounter for screening mammogram for malignant neoplasm of breast: Secondary | ICD-10-CM

## 2021-06-05 DIAGNOSIS — Z0000 Encounter for general adult medical examination without abnormal findings: Secondary | ICD-10-CM | POA: Diagnosis not present

## 2021-06-05 DIAGNOSIS — E785 Hyperlipidemia, unspecified: Secondary | ICD-10-CM | POA: Diagnosis not present

## 2021-06-05 DIAGNOSIS — Z1389 Encounter for screening for other disorder: Secondary | ICD-10-CM | POA: Diagnosis not present

## 2021-06-05 DIAGNOSIS — F909 Attention-deficit hyperactivity disorder, unspecified type: Secondary | ICD-10-CM | POA: Diagnosis not present

## 2021-06-05 DIAGNOSIS — M545 Low back pain, unspecified: Secondary | ICD-10-CM | POA: Diagnosis not present

## 2021-06-05 DIAGNOSIS — R7309 Other abnormal glucose: Secondary | ICD-10-CM | POA: Diagnosis not present

## 2021-12-01 DIAGNOSIS — I10 Essential (primary) hypertension: Secondary | ICD-10-CM | POA: Diagnosis not present

## 2021-12-01 DIAGNOSIS — M65312 Trigger thumb, left thumb: Secondary | ICD-10-CM | POA: Diagnosis not present

## 2021-12-01 DIAGNOSIS — R7309 Other abnormal glucose: Secondary | ICD-10-CM | POA: Diagnosis not present

## 2021-12-01 DIAGNOSIS — F909 Attention-deficit hyperactivity disorder, unspecified type: Secondary | ICD-10-CM | POA: Diagnosis not present

## 2021-12-01 DIAGNOSIS — Z5181 Encounter for therapeutic drug level monitoring: Secondary | ICD-10-CM | POA: Diagnosis not present

## 2021-12-01 DIAGNOSIS — Z1389 Encounter for screening for other disorder: Secondary | ICD-10-CM | POA: Diagnosis not present

## 2021-12-01 DIAGNOSIS — Z0131 Encounter for examination of blood pressure with abnormal findings: Secondary | ICD-10-CM | POA: Diagnosis not present

## 2021-12-01 DIAGNOSIS — Z23 Encounter for immunization: Secondary | ICD-10-CM | POA: Diagnosis not present

## 2021-12-01 DIAGNOSIS — Z0000 Encounter for general adult medical examination without abnormal findings: Secondary | ICD-10-CM | POA: Diagnosis not present

## 2021-12-01 DIAGNOSIS — Z1331 Encounter for screening for depression: Secondary | ICD-10-CM | POA: Diagnosis not present

## 2021-12-01 DIAGNOSIS — E785 Hyperlipidemia, unspecified: Secondary | ICD-10-CM | POA: Diagnosis not present

## 2021-12-01 DIAGNOSIS — N390 Urinary tract infection, site not specified: Secondary | ICD-10-CM | POA: Diagnosis not present

## 2021-12-02 DIAGNOSIS — Z5181 Encounter for therapeutic drug level monitoring: Secondary | ICD-10-CM | POA: Diagnosis not present

## 2021-12-03 DIAGNOSIS — R7309 Other abnormal glucose: Secondary | ICD-10-CM | POA: Diagnosis not present

## 2021-12-03 DIAGNOSIS — E039 Hypothyroidism, unspecified: Secondary | ICD-10-CM | POA: Diagnosis not present

## 2021-12-03 DIAGNOSIS — M8580 Other specified disorders of bone density and structure, unspecified site: Secondary | ICD-10-CM | POA: Diagnosis not present

## 2021-12-03 DIAGNOSIS — E785 Hyperlipidemia, unspecified: Secondary | ICD-10-CM | POA: Diagnosis not present

## 2021-12-15 ENCOUNTER — Other Ambulatory Visit: Payer: Self-pay | Admitting: Family Medicine

## 2021-12-15 DIAGNOSIS — Z1231 Encounter for screening mammogram for malignant neoplasm of breast: Secondary | ICD-10-CM

## 2021-12-19 IMAGING — MG MM DIGITAL SCREENING BILAT W/ TOMO AND CAD
8 series · 8 of 24 positions shown · non-contrast
Comparison: Previous exam(s).

CLINICAL DATA: Screening.

EXAM:
DIGITAL SCREENING BILATERAL MAMMOGRAM WITH TOMOSYNTHESIS AND CAD
TECHNIQUE: Bilateral screening digital craniocaudal and mediolateral oblique
mammograms were obtained. Bilateral screening digital breast
tomosynthesis was performed. The images were evaluated with
computer-aided detection.

[R CC synth-2D]
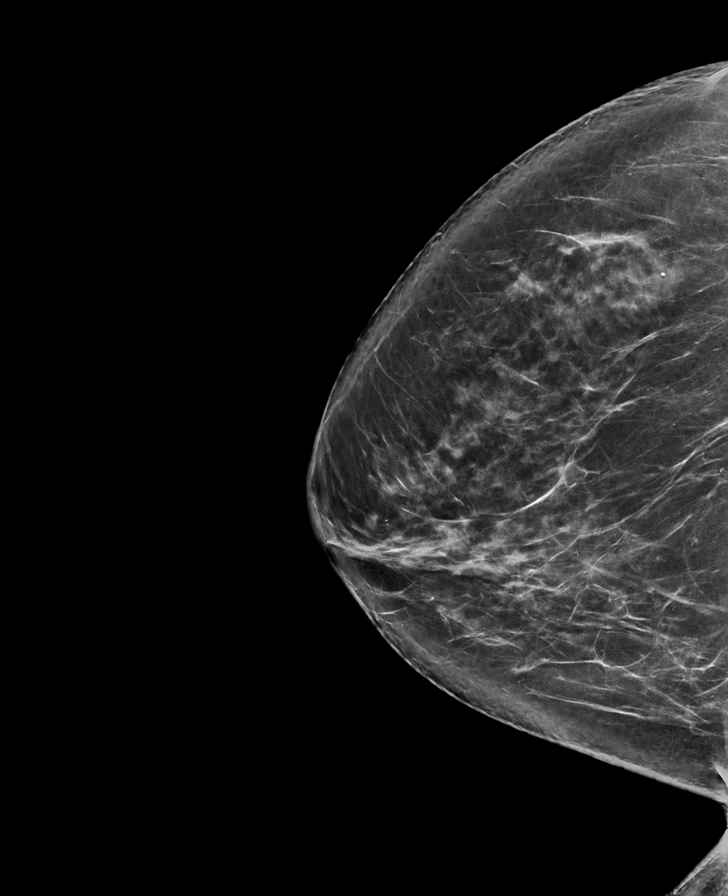

[L CC synth-2D]
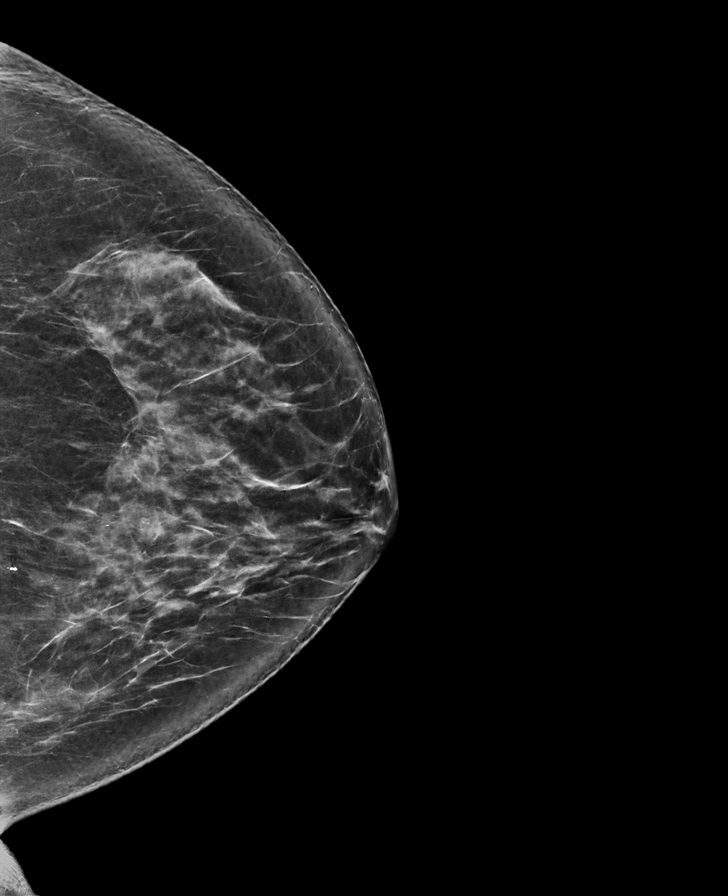

[R MLO synth-2D]
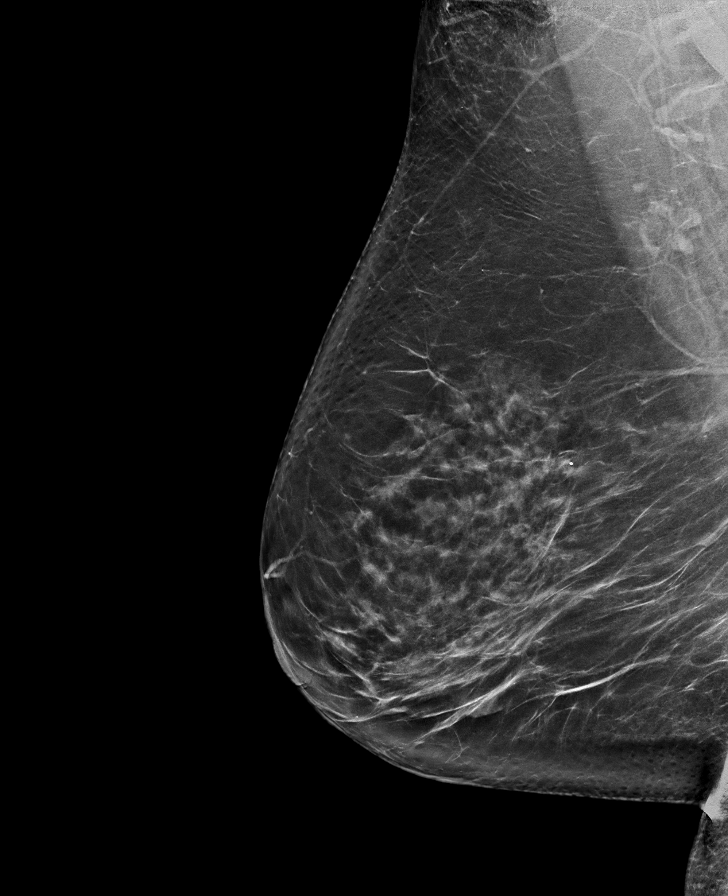

[L MLO synth-2D]
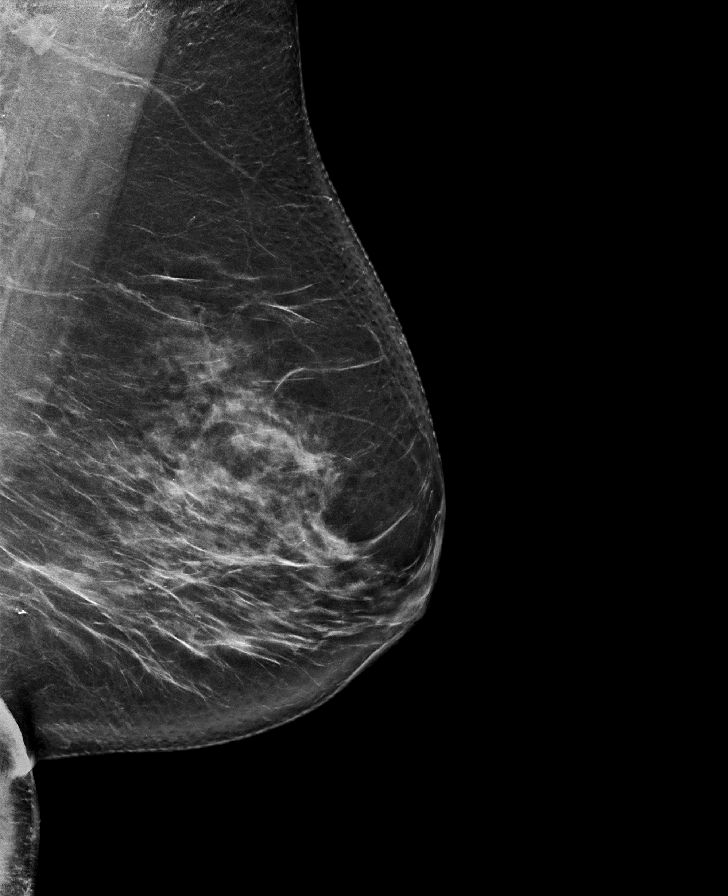

[L MLO tomo · tomo slice 45/90.0]
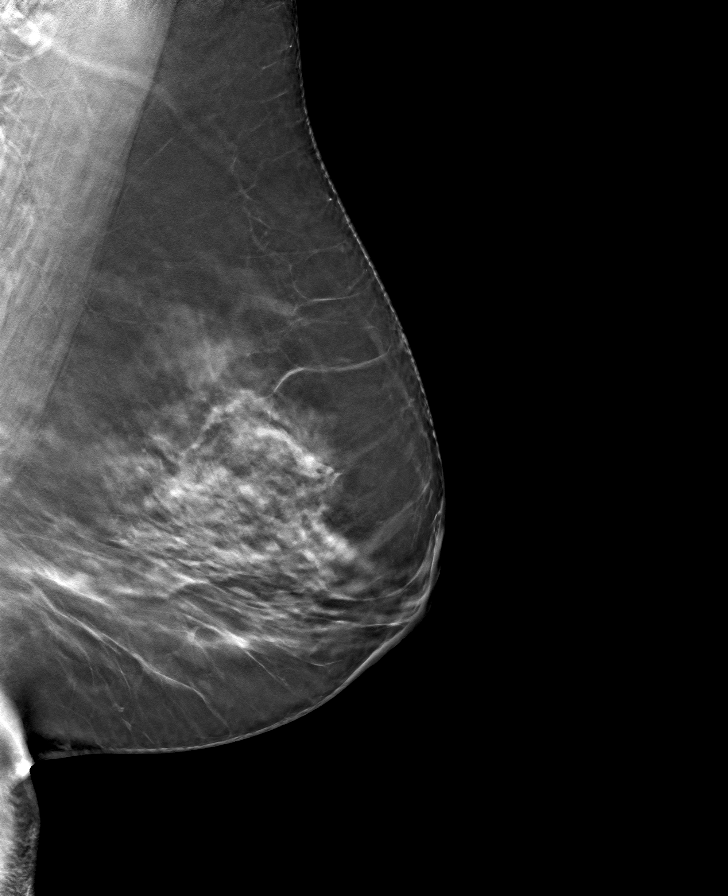

[R MLO tomo · tomo slice 45/90.0]
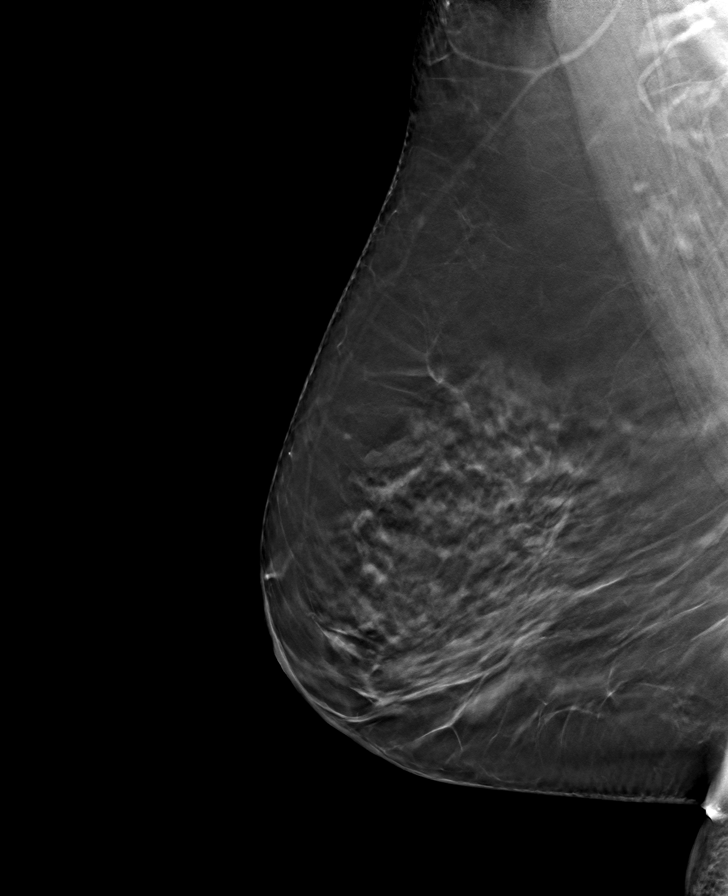

[L CC tomo · tomo slice 43/84.0]
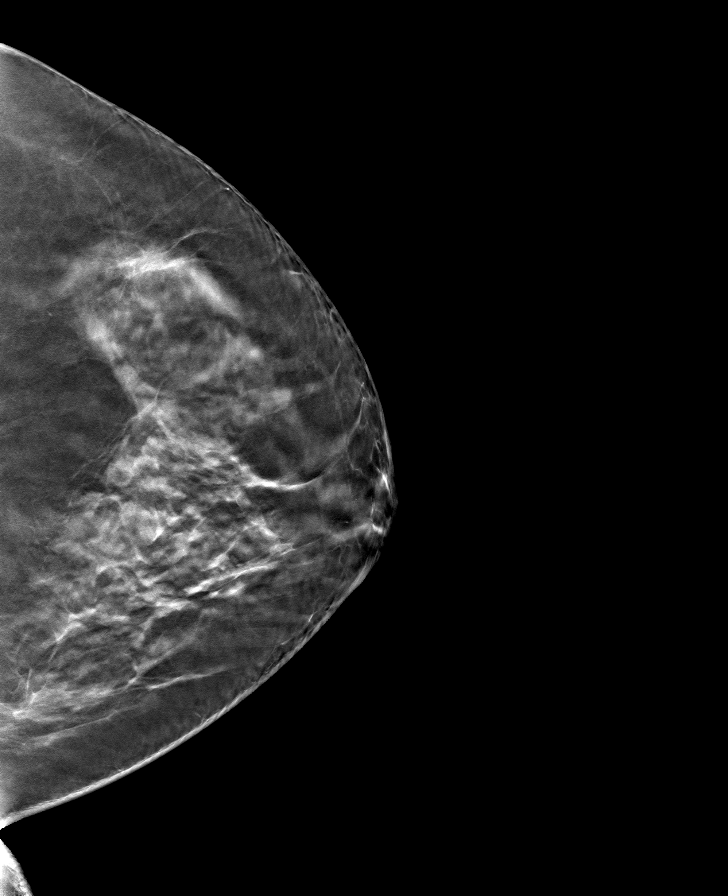

[R CC tomo · tomo slice 43/85.0]
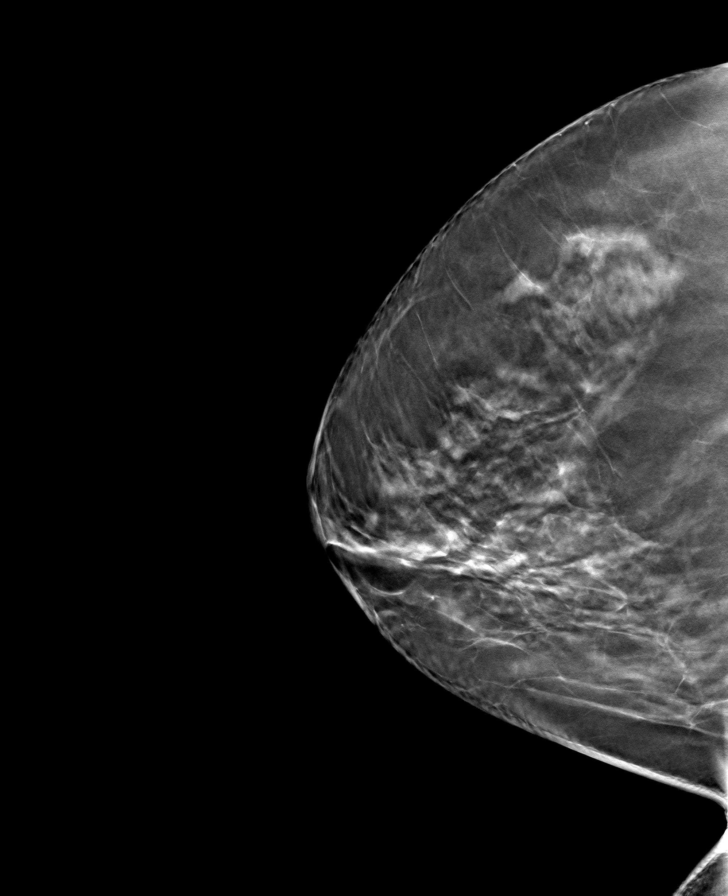

[8 of 24 positions shown; findings below may reference images not displayed]

ACR Breast Density Category c: The breast tissue is heterogeneously
dense, which may obscure small masses.
FINDINGS: There are no findings suspicious for malignancy.
IMPRESSION: No mammographic evidence of malignancy. A result letter of this
screening mammogram will be mailed directly to the patient.

RECOMMENDATION:
Screening mammogram in one year. (Code:Q3-W-BC3)

BI-RADS CATEGORY  1: Negative.

## 2022-06-04 ENCOUNTER — Ambulatory Visit
Admission: RE | Admit: 2022-06-04 | Discharge: 2022-06-04 | Disposition: A | Discharge status: Home / Self Care | Payer: Medicare Other | Source: Ambulatory Visit | Attending: Family Medicine | Admitting: Family Medicine

## 2022-06-04 DIAGNOSIS — Z1231 Encounter for screening mammogram for malignant neoplasm of breast: Secondary | ICD-10-CM | POA: Insufficient documentation

## 2022-06-29 ENCOUNTER — Other Ambulatory Visit: Payer: Self-pay | Admitting: Internal Medicine

## 2022-06-29 DIAGNOSIS — I10 Essential (primary) hypertension: Secondary | ICD-10-CM

## 2022-06-29 DIAGNOSIS — R0789 Other chest pain: Secondary | ICD-10-CM

## 2022-07-27 ENCOUNTER — Telehealth (HOSPITAL_COMMUNITY): Payer: Self-pay | Admitting: *Deleted

## 2022-07-27 MED ORDER — METOPROLOL TARTRATE 100 MG PO TABS: ORAL_TABLET | ORAL | 0 refills | Status: AC

## 2022-07-27 NOTE — Telephone Encounter (Signed)
Attempted to call patient regarding upcoming cardiac CT appointment. °Left message on voicemail with name and callback number ° °[name] RN Navigator Cardiac Imaging °[facility] °[phone] Office °[phone] Cell ° °

## 2022-07-27 NOTE — Telephone Encounter (Signed)
Reaching out to patient to offer assistance regarding upcoming cardiac imaging study; pt verbalizes understanding of appt date/time, parking situation and where to check in, pre-test NPO status and medications ordered, and verified current allergies; name and call back number provided for further questions should they arise  Larey Brick RN Navigator Cardiac Imaging [facility] [phone] office [phone] cell  Patient to hold her adderall. Patient to take 100mg  metoprolol tartrate two hours prior to her cardiac CT scan.

## 2022-07-29 ENCOUNTER — Ambulatory Visit
Admission: RE | Admit: 2022-07-29 | Discharge: 2022-07-29 | Disposition: A | Discharge status: Home / Self Care | Payer: Medicare Other | Source: Ambulatory Visit | Attending: Internal Medicine | Admitting: Internal Medicine

## 2022-07-29 DIAGNOSIS — I10 Essential (primary) hypertension: Secondary | ICD-10-CM | POA: Diagnosis present

## 2022-07-29 DIAGNOSIS — R0789 Other chest pain: Secondary | ICD-10-CM | POA: Diagnosis present

## 2022-07-29 DIAGNOSIS — I2510 Atherosclerotic heart disease of native coronary artery without angina pectoris: Secondary | ICD-10-CM | POA: Diagnosis not present

## 2022-07-29 LAB — POCT I-STAT CREATININE: Creatinine, Ser: 0.7 mg/dL (ref 0.44–1.00)

## 2022-07-29 MED ORDER — NITROGLYCERIN 0.4 MG SL SUBL
0.8000 mg | SUBLINGUAL_TABLET | Freq: Once | SUBLINGUAL | Status: AC
  Administered 2022-07-29: 0.8 mg via SUBLINGUAL

## 2022-07-29 MED ORDER — IOHEXOL 350 MG/ML SOLN
80.0000 mL | Freq: Once | INTRAVENOUS | Status: AC | PRN
  Administered 2022-07-29: 80 mL via INTRAVENOUS

## 2022-07-29 NOTE — Progress Notes (Signed)
Patient tolerated procedure well. Ambulate w/o difficulty. Denies any lightheadedness or being dizzy. Pt denies any pain at this time. Sitting in chair, pt is encouraged to drink additional water throughout the day and reason explained to patient. Patient verbalized understanding and all questions answered. ABC intact. No further needs at this time. Discharge from procedure area w/o issues.  

## 2022-08-27 ENCOUNTER — Encounter: Payer: Self-pay | Admitting: *Deleted

## 2022-09-07 ENCOUNTER — Telehealth: Payer: Self-pay

## 2022-09-07 NOTE — Telephone Encounter (Signed)
Reviewed her EGD from 2019.  There is no mention of Barrett's esophagus.  She had a Schatzki's ring that was dilated.  I do not recommend repeat EGD unless she has any GI symptoms  RV

## 2022-09-07 NOTE — Telephone Encounter (Signed)
Pt is calling regarding needing to schedule a colonoscopy and EGD. The EGD is due to history of Barrett's esophagus. Previous GI physician was Dr. Mechele Collin. Pt would like you to resume her care. Please okay for [place] to schedule her screening and for you to add on the EGD.

## 2022-09-08 NOTE — Telephone Encounter (Signed)
Called and left a message for call back  

## 2022-09-09 NOTE — Telephone Encounter (Signed)
Called and left a message for call back  

## 2022-09-10 NOTE — Telephone Encounter (Signed)
Patient states she is having GI she is having dysphagia  and GERD. She would like to see Dr. Allegra Lai. Made appointment with Dr. Allegra Lai but informed patient that her insurance is out of network with [place].  She states she wants to keep appointment because she will be getting new insurance she thinks in september. She will call us back if she does not

## 2022-11-04 ENCOUNTER — Other Ambulatory Visit: Payer: Self-pay

## 2022-11-04 ENCOUNTER — Telehealth: Payer: Self-pay

## 2022-11-04 NOTE — Telephone Encounter (Signed)
Patient has appointment with our office on 11/11/2022 and [facility] on 11/10/2022. She was told when scheduling her insurance was out of network with [facility] but she still wanted to schedule but would call back if she found a GI provider in network. It looks like she did so I called and left a message for call back to find out if she is seeing [facility] or our office

## 2022-11-05 NOTE — Telephone Encounter (Signed)
Called and left a message for call back  

## 2022-11-05 NOTE — Telephone Encounter (Signed)
Patient called back and she states yes she wants to cancel the appointment for 11/11/2022

## 2022-11-11 ENCOUNTER — Ambulatory Visit: Payer: Medicare Other | Admitting: Gastroenterology

## 2022-12-10 ENCOUNTER — Other Ambulatory Visit: Payer: Self-pay

## 2023-07-09 ENCOUNTER — Other Ambulatory Visit: Payer: Self-pay | Admitting: Family Medicine

## 2023-07-09 DIAGNOSIS — Z1231 Encounter for screening mammogram for malignant neoplasm of breast: Secondary | ICD-10-CM

## 2023-07-12 ENCOUNTER — Ambulatory Visit
Admission: RE | Admit: 2023-07-12 | Discharge: 2023-07-12 | Disposition: A | Discharge status: Home / Self Care | Source: Ambulatory Visit | Attending: Family Medicine | Admitting: Family Medicine

## 2023-07-12 DIAGNOSIS — Z1231 Encounter for screening mammogram for malignant neoplasm of breast: Secondary | ICD-10-CM | POA: Insufficient documentation
